# Patient Record
Sex: Male | Born: 1995 | Race: Black or African American | Hispanic: No | Marital: Single | Smoking: Never smoker
Health system: Southern US, Community
[De-identification: ages and names within clinical notes are randomized; demographics above are authoritative.]

## PROBLEM LIST (undated history)

## (undated) DIAGNOSIS — J45909 Unspecified asthma, uncomplicated: Secondary | ICD-10-CM

---
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
## 2014-07-09 IMAGING — CT CT HEAD WITHOUT CONTRAST
1 series · 16 of 30 positions shown, 20 images · non-contrast
Comparison: none

REASON FOR EXAM: syncope head injury
COMMENTS:

[Series 2: soft tissue · axial · 0.41mm/px · z∈[+2,+138]mm · 16 of 30 slices shown, 20 images]
[im 2/30  brain]
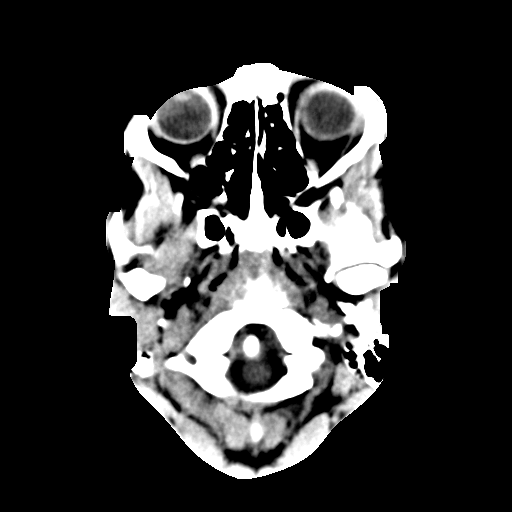
[im 2/30  bone]
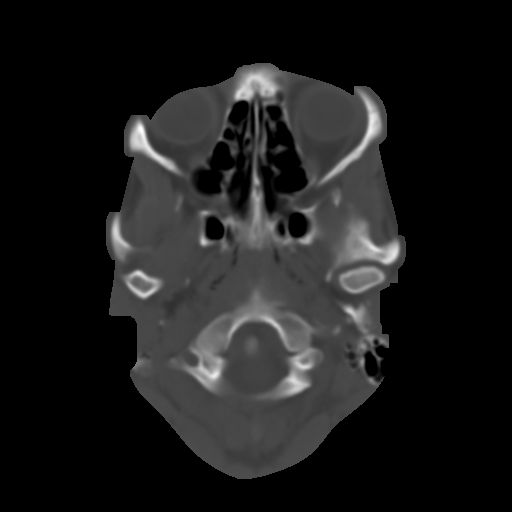
[im 4/30  brain]
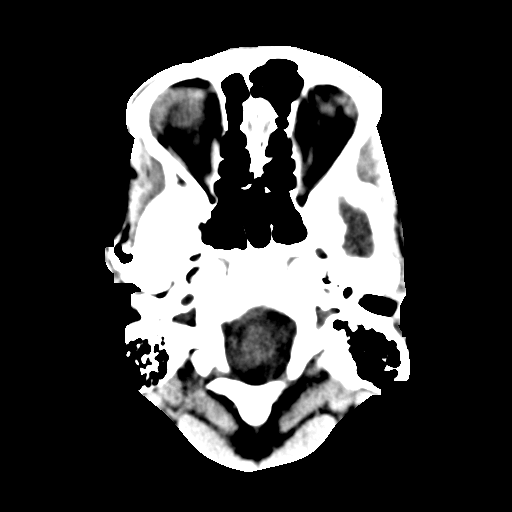
[im 6/30  brain]
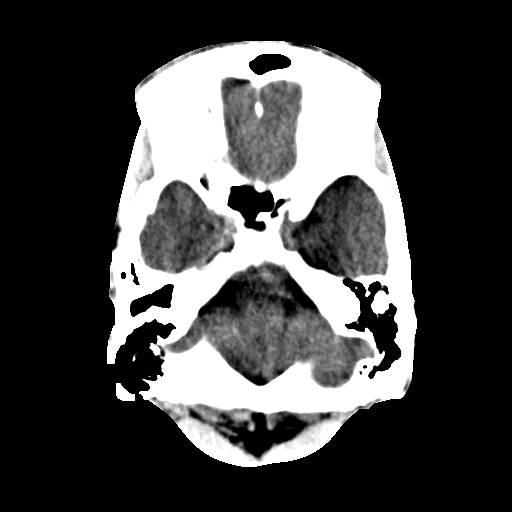
[im 8/30  brain]
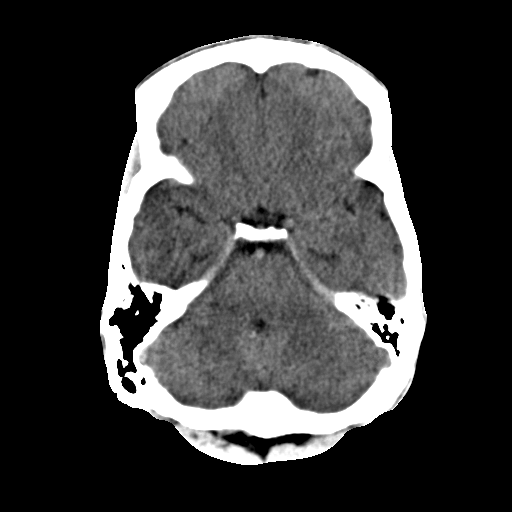
[im 9/30  brain]
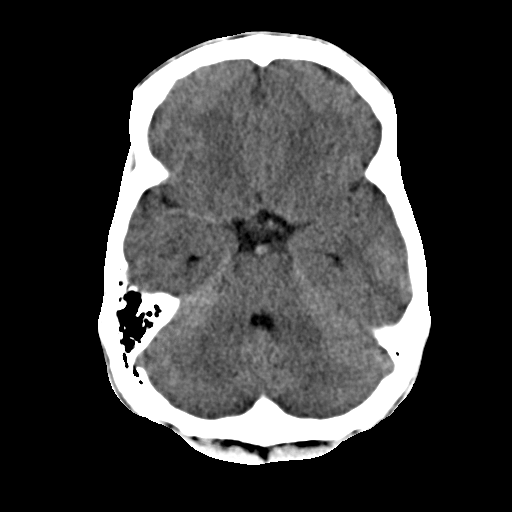
[im 9/30  bone]
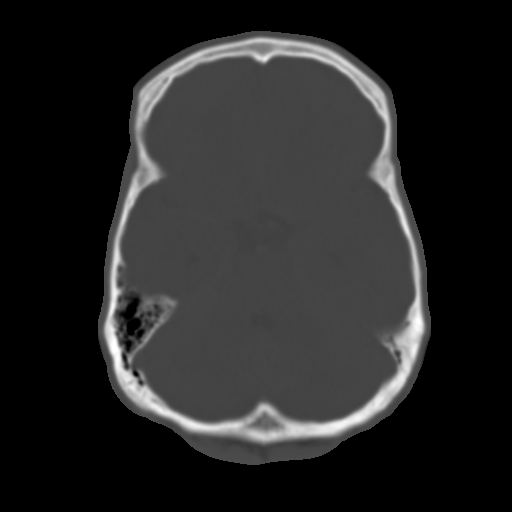
[im 11/30  brain]
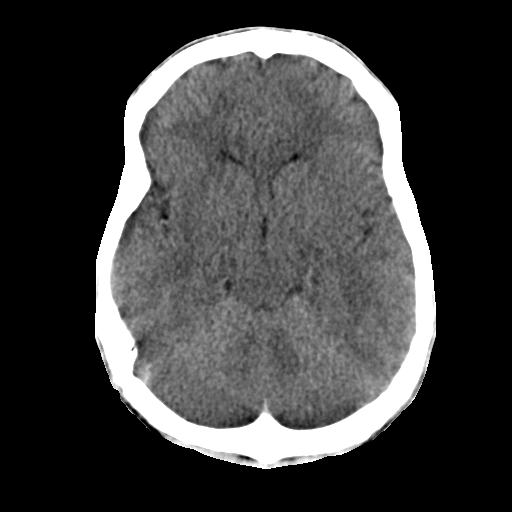
[im 13/30  brain]
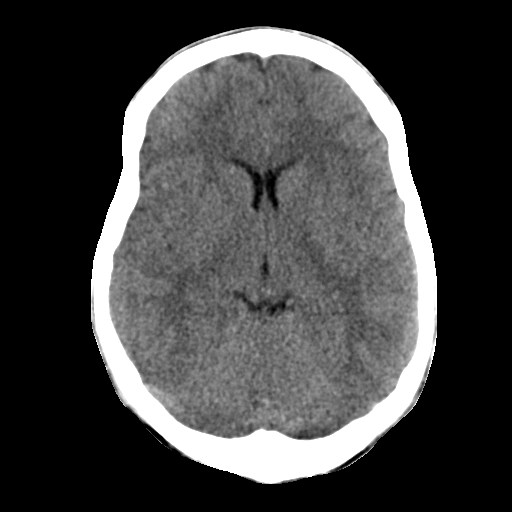
[im 15/30  brain]
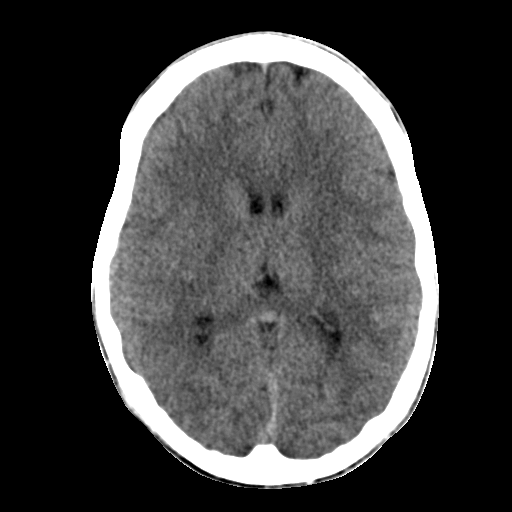
[im 16/30  brain]
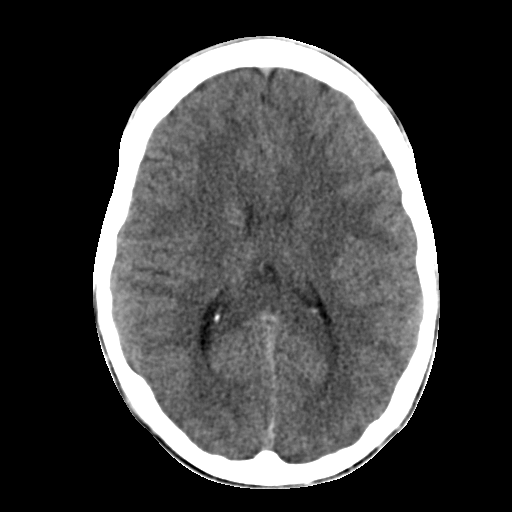
[im 16/30  bone]
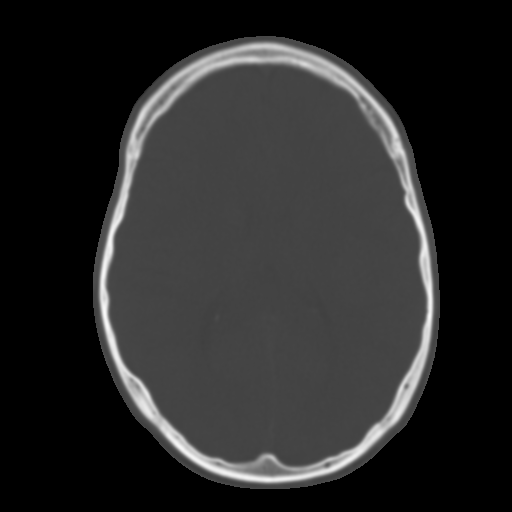
[im 18/30  brain]
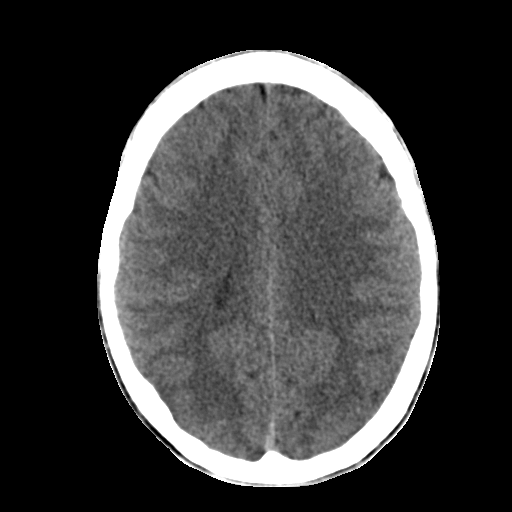
[im 20/30  brain]
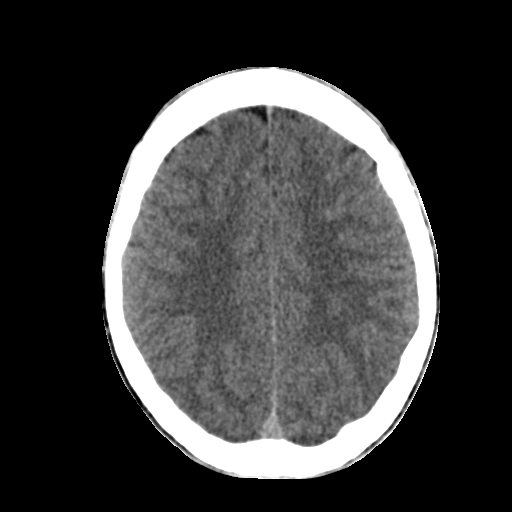
[im 22/30  brain]
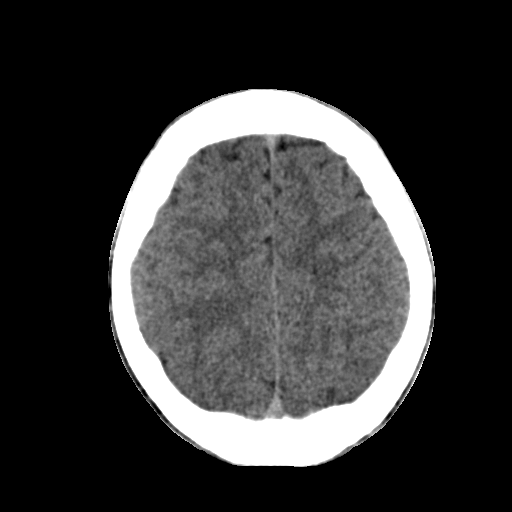
[im 23/30  brain]
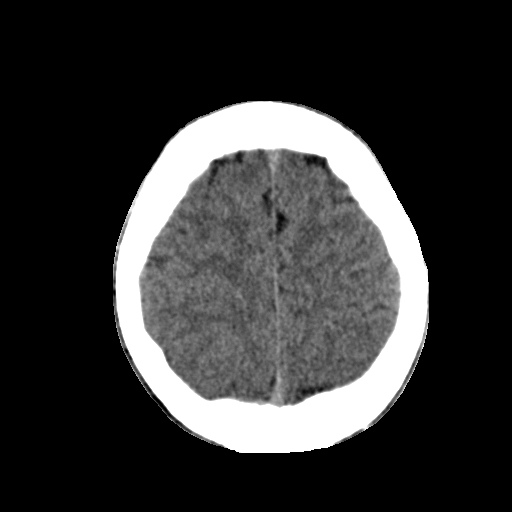
[im 23/30  bone]
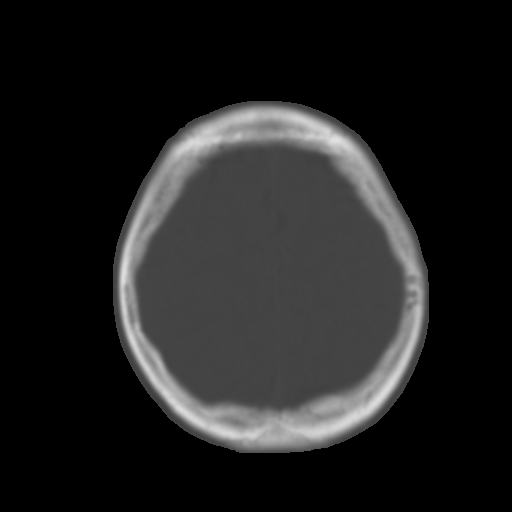
[im 25/30  brain]
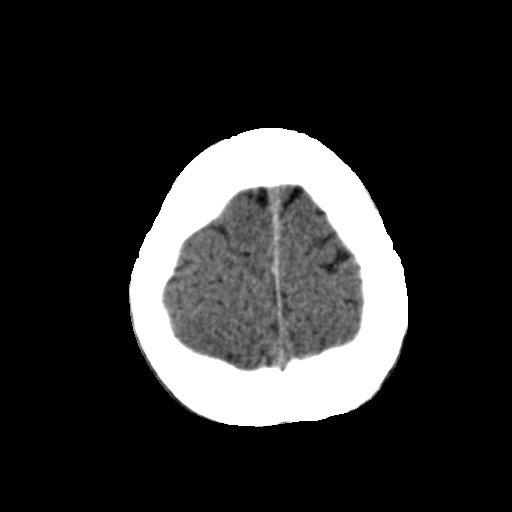
[im 27/30  brain]
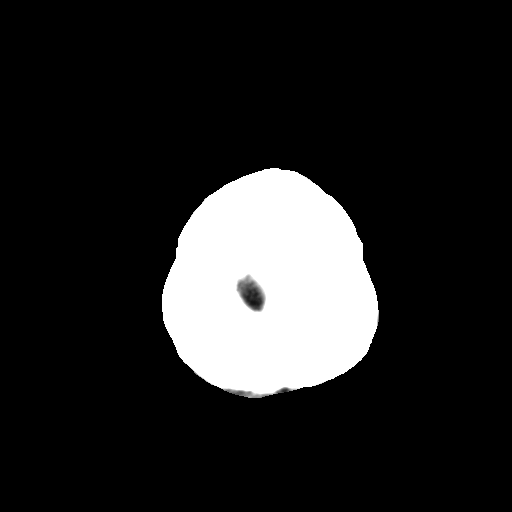
[im 29/30  brain]
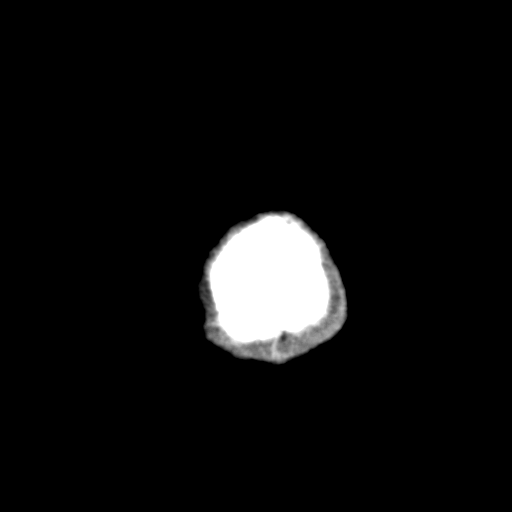

[16 of 30 positions shown; findings below may reference images not displayed]

PROCEDURE:     CT  - CT HEAD WITHOUT CONTRAST  - March 26, 2013  [DATE]

RESULT:     Emergent noncontrast CT of the brain is performed. The patient
has no previous exam for comparison.

The ventricles and sulci are normal. There is no hemorrhage. There is no
focal mass, mass-effect or midline shift. There is no evidence of edema or
territorial infarct. The bone windows demonstrate normal aeration of the
paranasal sinuses and mastoid air cells. There is no skull fracture
demonstrated.
IMPRESSION: 1. No acute intracranial abnormality.

[REDACTED]

## 2015-01-16 ENCOUNTER — Emergency Department (HOSPITAL_COMMUNITY)
Admission: EM | Admit: 2015-01-16 | Discharge: 2015-01-16 | Disposition: A | Discharge status: Home / Self Care | Payer: Self-pay | Attending: Emergency Medicine | Admitting: Emergency Medicine

## 2015-01-16 ENCOUNTER — Encounter (HOSPITAL_COMMUNITY): Payer: Self-pay | Admitting: *Deleted

## 2015-01-16 DIAGNOSIS — R42 Dizziness and giddiness: Secondary | ICD-10-CM | POA: Insufficient documentation

## 2015-01-16 DIAGNOSIS — B349 Viral infection, unspecified: Secondary | ICD-10-CM | POA: Insufficient documentation

## 2015-01-16 DIAGNOSIS — Z79899 Other long term (current) drug therapy: Secondary | ICD-10-CM | POA: Insufficient documentation

## 2015-01-16 DIAGNOSIS — J45909 Unspecified asthma, uncomplicated: Secondary | ICD-10-CM | POA: Insufficient documentation

## 2015-01-16 HISTORY — DX: Unspecified asthma, uncomplicated: J45.909

## 2015-01-16 LAB — CBC WITH DIFFERENTIAL/PLATELET
Basophils Absolute: 0 K/uL (ref 0.0–0.1)
Basophils Relative: 1 % (ref 0–1)
Eosinophils Absolute: 0 K/uL (ref 0.0–0.7)
Eosinophils Relative: 0 % (ref 0–5)
HCT: 43.6 % (ref 39.0–52.0)
Hemoglobin: 15.7 g/dL (ref 13.0–17.0)
Lymphocytes Relative: 15 % (ref 12–46)
Lymphs Abs: 0.6 K/uL — ABNORMAL LOW (ref 0.7–4.0)
MCH: 30.5 pg (ref 26.0–34.0)
MCHC: 36 g/dL (ref 30.0–36.0)
MCV: 84.7 fL (ref 78.0–100.0)
Monocytes Absolute: 0.4 K/uL (ref 0.1–1.0)
Monocytes Relative: 10 % (ref 3–12)
Neutro Abs: 3 K/uL (ref 1.7–7.7)
Neutrophils Relative %: 73 % (ref 43–77)
Platelets: 116 K/uL — ABNORMAL LOW (ref 150–400)
RBC: 5.15 MIL/uL (ref 4.22–5.81)
RDW: 11.8 % (ref 11.5–15.5)
WBC: 4.1 K/uL (ref 4.0–10.5)

## 2015-01-16 LAB — COMPREHENSIVE METABOLIC PANEL
ALK PHOS: 82 U/L (ref 39–117)
ALT: 36 U/L (ref 0–53)
AST: 45 U/L — ABNORMAL HIGH (ref 0–37)
Albumin: 4.3 g/dL (ref 3.5–5.2)
Anion gap: 8 (ref 5–15)
BILIRUBIN TOTAL: 1.3 mg/dL — AB (ref 0.3–1.2)
BUN: 13 mg/dL (ref 6–23)
CALCIUM: 9 mg/dL (ref 8.4–10.5)
CO2: 28 mmol/L (ref 19–32)
CREATININE: 1.17 mg/dL (ref 0.50–1.35)
Chloride: 97 mmol/L (ref 96–112)
GFR calc Af Amer: >90 mL/min (ref >90)
GFR calc non Af Amer: 89 mL/min — ABNORMAL LOW (ref >90)
Glucose, Bld: 104 mg/dL — ABNORMAL HIGH (ref 70–99)
Potassium: 3.5 mmol/L (ref 3.5–5.1)
Sodium: 133 mmol/L — ABNORMAL LOW (ref 135–145)
Total Protein: 8.2 g/dL (ref 6.0–8.3)

## 2015-01-16 LAB — URINALYSIS, ROUTINE W REFLEX MICROSCOPIC
Bilirubin Urine: NEGATIVE
Glucose, UA: 100 mg/dL — AB
Hgb urine dipstick: NEGATIVE
Ketones, ur: 40 mg/dL — AB
Leukocytes, UA: NEGATIVE
Nitrite: NEGATIVE
Protein, ur: 30 mg/dL — AB
Specific Gravity, Urine: 1.018 (ref 1.005–1.030)
UROBILINOGEN UA: 1 mg/dL (ref 0.0–1.0)
pH: 6 (ref 5.0–8.0)

## 2015-01-16 LAB — URINE MICROSCOPIC-ADD ON

## 2015-01-16 MED ORDER — PROMETHAZINE HCL 25 MG PO TABS: 25.0000 mg | ORAL_TABLET | Freq: Four times a day (QID) | ORAL | Status: AC | PRN

## 2015-01-16 MED ORDER — ONDANSETRON HCL 4 MG/2ML IJ SOLN
4.0000 mg | Freq: Once | INTRAMUSCULAR | Status: AC
  Administered 2015-01-16: 4 mg via INTRAVENOUS
  Filled 2015-01-16: qty 2

## 2015-01-16 MED ORDER — KETOROLAC TROMETHAMINE 30 MG/ML IJ SOLN
30.0000 mg | Freq: Once | INTRAMUSCULAR | Status: AC
  Administered 2015-01-16: 30 mg via INTRAVENOUS
  Filled 2015-01-16: qty 1

## 2015-01-16 MED ORDER — SODIUM CHLORIDE 0.9 % IV BOLUS (SEPSIS)
1000.0000 mL | Freq: Once | INTRAVENOUS | Status: AC
  Administered 2015-01-16: 1000 mL via INTRAVENOUS

## 2015-01-16 NOTE — ED Provider Notes (Signed)
CSN: [id number]     Arrival date & time 01/16/15  1953 History   First MD Initiated Contact with Patient 01/16/15 2109     Chief Complaint  Patient presents with  . multiple complaints      (Consider location/radiation/quality/duration/timing/severity/associated sxs/prior Treatment) HPI Comments: Patient presents to the ER with multiple complaints. Patient has been sick for 2 days. He has been running a fever at home. He has had a headache. He has felt hot and cold chills. There is no neck pain or stiffness. He denies chest pain, cough, shortness of breath. He has had nausea, vomiting, diarrhea but no abdominal pain. Patient reports that he currently feels weak and dizzy.   Past Medical History  Diagnosis Date  . Asthma    History reviewed. No pertinent past surgical history. No family history on file. History  Substance Use Topics  . Smoking status: Never Smoker   . Smokeless tobacco: Not on file  . Alcohol Use: No    Review of Systems  Constitutional: Positive for fever, chills and fatigue.  Gastrointestinal: Negative for nausea, vomiting and diarrhea.  Neurological: Positive for dizziness and headaches.  All other systems reviewed and are negative.     Allergies  Review of patient's allergies indicates no known allergies.  Home Medications   Prior to Admission medications   Medication Sig Start Date End Date Taking? Authorizing Provider  albuterol (PROVENTIL HFA;VENTOLIN HFA) 108 (90 BASE) MCG/ACT inhaler Inhale 1 puff into the lungs every 6 (six) hours as needed for wheezing or shortness of breath.   Yes Historical Provider, MD  promethazine (PHENERGAN) 25 MG tablet Take 1 tablet (25 mg total) by mouth every 6 (six) hours as needed for nausea or vomiting. 01/16/15   Gilda Creasehristopher J. Pollina, MD   BP 94/59 mmHg  Pulse 110  Temp(Src) 98.5 F (36.9 C) (Oral)  Resp 18  SpO2 97% Physical Exam  Constitutional: He is oriented to person, place, and time. He appears  well-developed and well-nourished. No distress.  HENT:  Head: Normocephalic and atraumatic.  Right Ear: Hearing normal.  Left Ear: Hearing normal.  Nose: Nose normal.  Mouth/Throat: Oropharynx is clear and moist and mucous membranes are normal.  Eyes: Conjunctivae and EOM are normal. Pupils are equal, round, and reactive to light.  Neck: Normal range of motion. Neck supple.  Cardiovascular: Regular rhythm, S1 normal and S2 normal.  Exam reveals no gallop and no friction rub.   No murmur heard. Pulmonary/Chest: Effort normal and breath sounds normal. No respiratory distress. He exhibits no tenderness.  Abdominal: Soft. Normal appearance and bowel sounds are normal. There is no hepatosplenomegaly. There is no tenderness. There is no rebound, no guarding, no tenderness at McBurney's point and negative Murphy's sign. No hernia.  Musculoskeletal: Normal range of motion.  Neurological: He is alert and oriented to person, place, and time. He has normal strength. No cranial nerve deficit or sensory deficit. Coordination normal. GCS eye subscore is 4. GCS verbal subscore is 5. GCS motor subscore is 6.  Skin: Skin is warm, dry and intact. No rash noted. No cyanosis.  Psychiatric: He has a normal mood and affect. His speech is normal and behavior is normal. Thought content normal.  Nursing note and vitals reviewed.   ED Course  Procedures (including critical care time) Labs Review Labs Reviewed  CBC WITH DIFFERENTIAL/PLATELET - Abnormal; Notable for the following:    Platelets 116 (*)    Lymphs Abs 0.6 (*)    All other  components within normal limits  COMPREHENSIVE METABOLIC PANEL - Abnormal; Notable for the following:    Sodium 133 (*)    Glucose, Bld 104 (*)    AST 45 (*)    Total Bilirubin 1.3 (*)    GFR calc non Af Amer 89 (*)    All other components within normal limits  URINALYSIS, ROUTINE W REFLEX MICROSCOPIC - Abnormal; Notable for the following:    Glucose, UA 100 (*)    Ketones,  ur 40 (*)    Protein, ur 30 (*)    All other components within normal limits  URINE MICROSCOPIC-ADD ON - Abnormal; Notable for the following:    Squamous Epithelial / LPF FEW (*)    Bacteria, UA FEW (*)    All other components within normal limits    Imaging Review No results found.   EKG Interpretation None      MDM   Final diagnoses:  Viral syndrome    Patient presents to the ER with multiple complaints. Patient complaining of headache, fatigue, chills, fever. He does not have any neck pain or stiffness. Negative meningismus. No concern for meningitis. His examination is unremarkable. He is slightly tachycardic and has had vomiting and diarrhea. Abdominal exam, however, is benign. Patient hydrated and treated symptomatically. Will continue symptomatically treatment as an outpatient.    Gilda Crease, MD 01/16/15 [id number]

## 2015-01-16 NOTE — ED Notes (Signed)
The pt ha shad multiple suymptoms since Thursday with a headache.  Hot and cold   Spells coughing  Non-productive,.  Hurting all over since then

## 2015-01-16 NOTE — Discharge Instructions (Signed)

## 2015-01-16 NOTE — ED Notes (Signed)
Pt A&OX4, ambulatory at d/c with steady gait, NAD
